# Patient Record
Sex: Female | Born: 1990 | Race: Black or African American | Hispanic: No | Marital: Single | Smoking: Former smoker
Health system: Southern US, Community
[De-identification: ages and names within clinical notes are randomized; demographics above are authoritative.]

## PROBLEM LIST (undated history)

## (undated) ENCOUNTER — Inpatient Hospital Stay (HOSPITAL_COMMUNITY): Payer: Self-pay

## (undated) DIAGNOSIS — Z789 Other specified health status: Secondary | ICD-10-CM

## (undated) HISTORY — PX: NO PAST SURGERIES: SHX2092

---

## 2013-08-16 ENCOUNTER — Encounter (HOSPITAL_COMMUNITY): Payer: Self-pay | Admitting: *Deleted

## 2013-08-16 ENCOUNTER — Inpatient Hospital Stay (HOSPITAL_COMMUNITY)
Admission: AD | Admit: 2013-08-16 | Discharge: 2013-08-16 | Disposition: A | Discharge status: Home / Self Care | Payer: Medicaid - Out of State | Source: Ambulatory Visit | Attending: Obstetrics & Gynecology | Admitting: Obstetrics & Gynecology

## 2013-08-16 ENCOUNTER — Inpatient Hospital Stay (HOSPITAL_COMMUNITY): Payer: Medicaid - Out of State

## 2013-08-16 DIAGNOSIS — O2390 Unspecified genitourinary tract infection in pregnancy, unspecified trimester: Secondary | ICD-10-CM | POA: Diagnosis not present

## 2013-08-16 DIAGNOSIS — O9989 Other specified diseases and conditions complicating pregnancy, childbirth and the puerperium: Secondary | ICD-10-CM | POA: Diagnosis not present

## 2013-08-16 DIAGNOSIS — O26899 Other specified pregnancy related conditions, unspecified trimester: Secondary | ICD-10-CM

## 2013-08-16 DIAGNOSIS — O99891 Other specified diseases and conditions complicating pregnancy: Secondary | ICD-10-CM | POA: Insufficient documentation

## 2013-08-16 DIAGNOSIS — N83209 Unspecified ovarian cyst, unspecified side: Secondary | ICD-10-CM | POA: Insufficient documentation

## 2013-08-16 DIAGNOSIS — K5900 Constipation, unspecified: Secondary | ICD-10-CM | POA: Insufficient documentation

## 2013-08-16 DIAGNOSIS — Z87891 Personal history of nicotine dependence: Secondary | ICD-10-CM | POA: Insufficient documentation

## 2013-08-16 DIAGNOSIS — B373 Candidiasis of vulva and vagina: Secondary | ICD-10-CM | POA: Diagnosis not present

## 2013-08-16 DIAGNOSIS — R109 Unspecified abdominal pain: Secondary | ICD-10-CM

## 2013-08-16 DIAGNOSIS — B3731 Acute candidiasis of vulva and vagina: Secondary | ICD-10-CM | POA: Diagnosis not present

## 2013-08-16 DIAGNOSIS — R1032 Left lower quadrant pain: Secondary | ICD-10-CM | POA: Diagnosis present

## 2013-08-16 HISTORY — DX: Other specified health status: Z78.9

## 2013-08-16 LAB — WET PREP, GENITAL
Clue Cells Wet Prep HPF POC: NONE SEEN
Trich, Wet Prep: NONE SEEN
WBC, Wet Prep HPF POC: NONE SEEN

## 2013-08-16 LAB — URINALYSIS, ROUTINE W REFLEX MICROSCOPIC
Bilirubin Urine: NEGATIVE
GLUCOSE, UA: NEGATIVE mg/dL
HGB URINE DIPSTICK: NEGATIVE
KETONES UR: NEGATIVE mg/dL
Leukocytes, UA: NEGATIVE
Nitrite: NEGATIVE
Protein, ur: NEGATIVE mg/dL
Specific Gravity, Urine: 1.015 (ref 1.005–1.030)
UROBILINOGEN UA: 1 mg/dL (ref 0.0–1.0)
pH: 6.5 (ref 5.0–8.0)

## 2013-08-16 LAB — CBC WITH DIFFERENTIAL/PLATELET
BASOS ABS: 0 10*3/uL (ref 0.0–0.1)
BASOS PCT: 0 % (ref 0–1)
EOS PCT: 0 % (ref 0–5)
Eosinophils Absolute: 0 10*3/uL (ref 0.0–0.7)
HEMATOCRIT: 31.9 % — AB (ref 36.0–46.0)
HEMOGLOBIN: 10.4 g/dL — AB (ref 12.0–15.0)
Lymphocytes Relative: 40 % (ref 12–46)
Lymphs Abs: 1.9 10*3/uL (ref 0.7–4.0)
MCH: 27.4 pg (ref 26.0–34.0)
MCHC: 32.6 g/dL (ref 30.0–36.0)
MCV: 83.9 fL (ref 78.0–100.0)
MONO ABS: 0.7 10*3/uL (ref 0.1–1.0)
MONOS PCT: 15 % — AB (ref 3–12)
Neutro Abs: 2.1 10*3/uL (ref 1.7–7.7)
Neutrophils Relative %: 45 % (ref 43–77)
Platelets: 235 10*3/uL (ref 150–400)
RBC: 3.8 MIL/uL — ABNORMAL LOW (ref 3.87–5.11)
RDW: 14.4 % (ref 11.5–15.5)
WBC: 4.7 10*3/uL (ref 4.0–10.5)

## 2013-08-16 LAB — HCG, QUANTITATIVE, PREGNANCY: hCG, Beta Chain, Quant, S: 129 m[IU]/mL — ABNORMAL HIGH (ref <5)

## 2013-08-16 LAB — ABO/RH: ABO/RH(D): O POS

## 2013-08-16 LAB — POCT PREGNANCY, URINE: Preg Test, Ur: POSITIVE — AB

## 2013-08-16 LAB — HIV ANTIBODY (ROUTINE TESTING W REFLEX): HIV 1&2 Ab, 4th Generation: NONREACTIVE

## 2013-08-16 MED ORDER — FLUCONAZOLE 150 MG PO TABS
150.0000 mg | ORAL_TABLET | Freq: Once | ORAL | Status: AC
  Administered 2013-08-16: 150 mg via ORAL
  Filled 2013-08-16: qty 1

## 2013-08-16 NOTE — Discharge Instructions (Signed)
Pregnancy, First Trimester °The first trimester is the first 3 months your baby is growing inside you. It is important to follow your doctor's instructions. °HOME CARE  °· Do not smoke. °· Do not drink alcohol. °· Only take medicine as told by your doctor. °· Exercise. °· Eat healthy foods. Eat regular, well-balanced meals. °· You can have sex (intercourse) if there are no other problems with the pregnancy. °· Things that help with morning sickness: °¨ Eat soda crackers before getting up in the morning. °¨ Eat 4 to 5 small meals rather than 3 large meals. °¨ Drink liquids between meals, not during meals. °· Go to all appointments as told. °· Take all vitamins or supplements as told by your doctor. °GET HELP RIGHT AWAY IF:  °· You develop a fever. °· You have a bad smelling fluid that is leaking from your vagina. °· There is bleeding from the vagina. °· You develop severe belly (abdominal) or back pain. °· You throw up (vomit) blood. It may look like coffee grounds. °· You lose more than 2 pounds in a week. °· You gain 5 pounds or more in a week. °· You gain more than 2 pounds in a week and you see puffiness (swelling) in your feet, ankles, or legs. °· You have severe dizziness or pass out (faint). °· You are around people who have German measles, chickenpox, or fifth disease. °· You have a headache, watery poop (diarrhea), pain with peeing (urinating), or cannot breath right. °Document Released: 07/13/2007 Document Revised: 04/18/2011 Document Reviewed: 12/04/2012 °ExitCare® Patient Information ©2015 [employer]. This information is not intended to replace advice given to you by your health care provider. Make sure you discuss any questions you have with your health care provider. ° °

## 2013-08-16 NOTE — MAU Provider Note (Signed)
History     CSN: [id number]  Arrival date and time: 08/16/13 1123   First Provider Initiated Contact with Patient 08/16/13 1153      Chief Complaint  Patient presents with  . Abdominal Pain  . Possible Pregnancy   HPI  [name] is a 23 y/o G2P1001 at 8152w1d who presents with 1 week duration LLQ abdominal discomfort. Pain is rated as moderate to severe achy soreness that is intermittent and comes on suddenly. Patient has a past Hx of similar discomfort that she associates with long standing constipation and incidentally identified ovarian cysts. She notes associated nausea x 6 days, urinary frequency, constipation, HA and decreased appetite. Patient is sexually active and was  previously using NuvaRing but decided not to replace the contraception device after she noticed a cessation of her chronic constipation with its removal approximately 5 weeks prior to today's visit.   Patient states that [facility] the previous night and was told that her beta HCG came back "in he 100's" but she was unable to wait around for follow up ultrasound and repeat b-HCG.   Patient denies fever, chills, recent sick contacts, weakness, diziness, vomiting, bleeding, painful urination, vaginal discharge.   OB History   Grav Para Term Preterm Abortions TAB SAB Ect Mult Living   2 1 1       1       Past Medical History  Diagnosis Date  . Medical history non-contributory     Past Surgical History  Procedure Laterality Date  . No past surgeries      History reviewed. No pertinent family history.  History  Substance Use Topics  . Smoking status: Former Games developermoker  . Smokeless tobacco: Never Used  . Alcohol Use: No    Allergies: No Known Allergies  Prescriptions prior to admission  Medication Sig Dispense Refill  . docusate sodium (COLACE) 100 MG capsule Take 100 mg by mouth daily.        Review of Systems  Constitutional: Negative for fever and  malaise/fatigue.  Gastrointestinal: Positive for nausea, abdominal pain and constipation. Negative for vomiting and diarrhea.  Genitourinary: Positive for frequency. Negative for dysuria and urgency.       Neg -vaginal bleeding, discharge   Physical Exam   Blood pressure 130/67, pulse 74, temperature 98.7 F (37.1 C), temperature source Oral, resp. rate 18, height 5' 3.25" (1.607 m), weight 176 lb (79.833 kg), last menstrual period 07/18/2013.  Physical Exam  Constitutional: She is oriented to person, place, and time. She appears well-developed and well-nourished. No distress.  HENT:  Head: Normocephalic and atraumatic.  Eyes: Right eye exhibits no discharge. Left eye exhibits no discharge. No scleral icterus.  Neck: Normal range of motion. Neck supple.  Cardiovascular: Normal rate, regular rhythm, normal heart sounds and intact distal pulses.   Respiratory: Effort normal and breath sounds normal. No respiratory distress.  GI: Soft. Bowel sounds are normal. She exhibits no distension and no mass. There is tenderness. There is no rebound and no guarding.   Mild tenderness to light palpation in the LLQ, no tenderness to percussion.   Genitourinary: Vagina normal and uterus normal. Uterus is not tender. Right adnexum displays no mass and no tenderness. Left adnexum displays no mass and no tenderness. No bleeding around the vagina. No vaginal discharge found.  Neurological: She is alert and oriented to person, place, and time. Coordination normal.  Skin: Skin is warm and dry. No erythema.  Psychiatric: She has a  normal mood and affect. Her behavior is normal.   Results for orders placed during the hospital encounter of 08/16/13 (from the past 24 hour(s))  URINALYSIS, ROUTINE W REFLEX MICROSCOPIC     Status: None   Collection Time    08/16/13 11:40 AM      Result Value Ref Range   Color, Urine YELLOW  YELLOW   APPearance CLEAR  CLEAR   Specific Gravity, Urine 1.015  1.005 - 1.030   pH  6.5  5.0 - 8.0   Glucose, UA NEGATIVE  NEGATIVE mg/dL   Hgb urine dipstick NEGATIVE  NEGATIVE   Bilirubin Urine NEGATIVE  NEGATIVE   Ketones, ur NEGATIVE  NEGATIVE mg/dL   Protein, ur NEGATIVE  NEGATIVE mg/dL   Urobilinogen, UA 1.0  0.0 - 1.0 mg/dL   Nitrite NEGATIVE  NEGATIVE   Leukocytes, UA NEGATIVE  NEGATIVE  POCT PREGNANCY, URINE     Status: Abnormal   Collection Time    08/16/13 11:44 AM      Result Value Ref Range   Preg Test, Ur POSITIVE (*) NEGATIVE  CBC WITH DIFFERENTIAL     Status: Abnormal   Collection Time    08/16/13 12:00 PM      Result Value Ref Range   WBC 4.7  4.0 - 10.5 K/uL   RBC 3.80 (*) 3.87 - 5.11 MIL/uL   Hemoglobin 10.4 (*) 12.0 - 15.0 g/dL   HCT 16.1 (*) 09.6 - 04.5 %   MCV 83.9  78.0 - 100.0 fL   MCH 27.4  26.0 - 34.0 pg   MCHC 32.6  30.0 - 36.0 g/dL   RDW 40.9  81.1 - 91.4 %   Platelets 235  150 - 400 K/uL   Neutrophils Relative % 45  43 - 77 %   Neutro Abs 2.1  1.7 - 7.7 K/uL   Lymphocytes Relative 40  12 - 46 %   Lymphs Abs 1.9  0.7 - 4.0 K/uL   Monocytes Relative 15 (*) 3 - 12 %   Monocytes Absolute 0.7  0.1 - 1.0 K/uL   Eosinophils Relative 0  0 - 5 %   Eosinophils Absolute 0.0  0.0 - 0.7 K/uL   Basophils Relative 0  0 - 1 %   Basophils Absolute 0.0  0.0 - 0.1 K/uL  ABO/RH     Status: None   Collection Time    08/16/13 12:00 PM      Result Value Ref Range   ABO/RH(D) O POS    HCG, QUANTITATIVE, PREGNANCY     Status: Abnormal   Collection Time    08/16/13 12:00 PM      Result Value Ref Range   hCG, Beta Chain, Quant, S 129 (*) <5 mIU/mL  WET PREP, GENITAL     Status: Abnormal   Collection Time    08/16/13 12:23 PM      Result Value Ref Range   Yeast Wet Prep HPF POC FEW (*) NONE SEEN   Trich, Wet Prep NONE SEEN  NONE SEEN   Clue Cells Wet Prep HPF POC NONE SEEN  NONE SEEN   WBC, Wet Prep HPF POC NONE SEEN  NONE SEEN    US Ob Comp Less 14 Wks  08/16/2013   CLINICAL DATA:  Lower abdominal pelvic pain, quantitative beta HCG 129   EXAM: OBSTETRIC <14 WK [place] AND TRANSVAGINAL OB US  TECHNIQUE: Both transabdominal and transvaginal ultrasound examinations were performed for complete evaluation of the gestation as well  as the maternal uterus, adnexal regions, and pelvic cul-de-sac. Transvaginal technique was performed to assess early pregnancy.  COMPARISON:  None.  FINDINGS: Intrauterine gestational sac: Not visualized  Yolk sac:  Not visualized  Embryo:  Not visualized  Cardiac Activity: Not visualized  Heart Rate:  Not detected  Maternal uterus/adnexae: Uterus and endometrium appear normal for age and unremarkable. No visualized IUP or adnexal abnormality. Ovaries are unremarkable. No significant free fluid. Trace physiologic fluid noted.  IMPRESSION: No visualized intrauterine pregnancy by ultrasound. No acute finding evident.   Electronically Signed   By: Ruel Favors M.D.   On: 08/16/2013 13:17   US Ob Transvaginal  08/16/2013   CLINICAL DATA:  Lower abdominal pelvic pain, quantitative beta HCG 129  EXAM: OBSTETRIC <14 WK [place] AND TRANSVAGINAL OB US  TECHNIQUE: Both transabdominal and transvaginal ultrasound examinations were performed for complete evaluation of the gestation as well as the maternal uterus, adnexal regions, and pelvic cul-de-sac. Transvaginal technique was performed to assess early pregnancy.  COMPARISON:  None.  FINDINGS: Intrauterine gestational sac: Not visualized  Yolk sac:  Not visualized  Embryo:  Not visualized  Cardiac Activity: Not visualized  Heart Rate:  Not detected  Maternal uterus/adnexae: Uterus and endometrium appear normal for age and unremarkable. No visualized IUP or adnexal abnormality. Ovaries are unremarkable. No significant free fluid. Trace physiologic fluid noted.  IMPRESSION: No visualized intrauterine pregnancy by ultrasound. No acute finding evident.   Electronically Signed   By: Ruel Favors M.D.   On: 08/16/2013 13:17    MAU Course  Procedures None  MDM +UPT  Assessment and Plan    Ectopic vs intrauterine pregnancy. R/O ectopy. CBC, UA, GC, wet prep, transvaginal ultrasound,  blood type and screen, b-HCG and F/U b-HCG in 48 hrs.   Vara Guardian PA-S   A: Abdominal pain in pregnancy, antepartum Candidal vulvovaginitis  P: Discharge home Ectopic precautions discussed Patient to return to MAU in 48 hours for follow-up quant hCG or sooner if symptoms worsen  I have seen and evaluated the patient with the NP/PA/Med student. I agree with the assessment and plan as written above.   Freddi Starr, PA-C  08/16/2013 3:14 PM

## 2013-08-16 NOTE — MAU Provider Note (Signed)

## 2013-08-16 NOTE — MAU Note (Addendum)
Wasn't feeling right, had 4 +HPT prior to going to hospital. [facility].  hcg level was low, was unable to stay for US as she had to go to work. Having pain on left side- was told needed to get checked further

## 2013-08-18 ENCOUNTER — Inpatient Hospital Stay (HOSPITAL_COMMUNITY)
Admission: AD | Admit: 2013-08-18 | Discharge: 2013-08-18 | Disposition: A | Discharge status: Home / Self Care | Payer: Medicaid - Out of State | Source: Ambulatory Visit | Attending: Obstetrics & Gynecology | Admitting: Obstetrics & Gynecology

## 2013-08-18 DIAGNOSIS — O99891 Other specified diseases and conditions complicating pregnancy: Secondary | ICD-10-CM

## 2013-08-18 DIAGNOSIS — K5900 Constipation, unspecified: Secondary | ICD-10-CM

## 2013-08-18 DIAGNOSIS — R1032 Left lower quadrant pain: Secondary | ICD-10-CM | POA: Diagnosis present

## 2013-08-18 DIAGNOSIS — O3680X0 Pregnancy with inconclusive fetal viability, not applicable or unspecified: Secondary | ICD-10-CM

## 2013-08-18 DIAGNOSIS — Z87891 Personal history of nicotine dependence: Secondary | ICD-10-CM | POA: Diagnosis not present

## 2013-08-18 DIAGNOSIS — O9989 Other specified diseases and conditions complicating pregnancy, childbirth and the puerperium: Principal | ICD-10-CM

## 2013-08-18 LAB — HCG, QUANTITATIVE, PREGNANCY: HCG, BETA CHAIN, QUANT, S: 110 m[IU]/mL — AB (ref <5)

## 2013-08-18 NOTE — Discharge Instructions (Signed)

## 2013-08-18 NOTE — MAU Provider Note (Signed)
  History     CSN: [id number]  Arrival date and time: 08/18/13 1428   First Provider Initiated Contact with Patient 08/18/13 1657      Chief Complaint  Patient presents with  . Follow-up   HPI Comments: Grant RutsShaqkia O Chillemi 23 y.o. G2P1001 4260w3d comes to MAU to follow up with pregnancy of unknown location. She denies any pain or bleeding. [place] normally seen in [place] but plans pregnancy care in [place]. She feels her LLQ pain was from her chronic constipation. This is a desired pregnancy     Past Medical History  Diagnosis Date  . Medical history non-contributory     Past Surgical History  Procedure Laterality Date  . No past surgeries      No family history on file.  History  Substance Use Topics  . Smoking status: Former Games developermoker  . Smokeless tobacco: Never Used  . Alcohol Use: No    Allergies: No Known Allergies  Prescriptions prior to admission  Medication Sig Dispense Refill  . docusate sodium (COLACE) 100 MG capsule Take 100 mg by mouth daily.        Review of Systems  Constitutional: Negative.   HENT: Negative.   Cardiovascular: Negative.   Gastrointestinal: Negative.   Genitourinary: Negative.   Musculoskeletal: Negative.   Skin: Negative.   Neurological: Negative.   Psychiatric/Behavioral: Negative.    Physical Exam   Blood pressure 119/75, pulse 71, temperature 98.2 F (36.8 C), temperature source Oral, resp. rate 16, last menstrual period 07/18/2013.  Physical Exam  Constitutional: She is oriented to person, place, and time. She appears well-developed and well-nourished. No distress.  HENT:  Head: Normocephalic and atraumatic.  Eyes: Conjunctivae are normal. Pupils are equal, round, and reactive to light.  Musculoskeletal: Normal range of motion.  Neurological: She is alert and oriented to person, place, and time.  Skin: Skin is warm and dry.  Psychiatric: She has a normal mood and affect. Her behavior is normal. Thought content normal.    Lab Results  Component Value Date   HCGBETAQNT 110* 08/18/2013   HCGBETAQNT 129* 08/16/2013      MAU Course  Procedures  MDM   Assessment and Plan  A: Pregnancy unknown Location  P: Repeat Quant in 48 hours Advised any pain/ bleeding return to MAU Ectopic precautions  Carolynn ServeBarefoot, Berle Fitz Miller 08/18/2013, 5:13 PM

## 2013-08-18 NOTE — MAU Note (Signed)
Here for rpt blood work. No c/o, feeling good.  Denies pain or bleeding.

## 2013-08-19 ENCOUNTER — Encounter (HOSPITAL_COMMUNITY): Payer: Self-pay | Admitting: *Deleted

## 2013-08-19 ENCOUNTER — Inpatient Hospital Stay (HOSPITAL_COMMUNITY)
Admission: AD | Admit: 2013-08-19 | Discharge: 2013-08-19 | Disposition: A | Discharge status: Home / Self Care | Payer: Medicaid - Out of State | Source: Ambulatory Visit | Attending: Obstetrics & Gynecology | Admitting: Obstetrics & Gynecology

## 2013-08-19 DIAGNOSIS — O209 Hemorrhage in early pregnancy, unspecified: Secondary | ICD-10-CM | POA: Diagnosis present

## 2013-08-19 DIAGNOSIS — O039 Complete or unspecified spontaneous abortion without complication: Secondary | ICD-10-CM | POA: Diagnosis not present

## 2013-08-19 DIAGNOSIS — Z87891 Personal history of nicotine dependence: Secondary | ICD-10-CM | POA: Diagnosis not present

## 2013-08-19 LAB — CBC
HCT: 33.5 % — ABNORMAL LOW (ref 36.0–46.0)
Hemoglobin: 10.8 g/dL — ABNORMAL LOW (ref 12.0–15.0)
MCH: 27.2 pg (ref 26.0–34.0)
MCHC: 32.2 g/dL (ref 30.0–36.0)
MCV: 84.4 fL (ref 78.0–100.0)
PLATELETS: 228 10*3/uL (ref 150–400)
RBC: 3.97 MIL/uL (ref 3.87–5.11)
RDW: 14.4 % (ref 11.5–15.5)
WBC: 4.4 10*3/uL (ref 4.0–10.5)

## 2013-08-19 LAB — HCG, QUANTITATIVE, PREGNANCY: hCG, Beta Chain, Quant, S: 97 m[IU]/mL — ABNORMAL HIGH (ref <5)

## 2013-08-19 LAB — GC/CHLAMYDIA PROBE AMP
CT PROBE, AMP APTIMA: NEGATIVE
GC PROBE AMP APTIMA: NEGATIVE

## 2013-08-19 NOTE — Discharge Instructions (Signed)
Miscarriage A miscarriage is the sudden loss of an unborn baby (fetus) before the 20th week of pregnancy. Most miscarriages happen in the first 3 months of pregnancy. Sometimes, it happens before a woman even knows she is pregnant. A miscarriage is also called a "spontaneous miscarriage" or "early pregnancy loss." Having a miscarriage can be an emotional experience. Talk with your caregiver about any questions you may have about miscarrying, the grieving process, and your future pregnancy plans. CAUSES   Problems with the fetal chromosomes that make it impossible for the baby to develop normally. Problems with the baby's genes or chromosomes are most often the result of errors that occur, by chance, as the embryo divides and grows. The problems are not inherited from the parents.  Infection of the cervix or uterus.   Hormone problems.   Problems with the cervix, such as having an incompetent cervix. This is when the tissue in the cervix is not strong enough to hold the pregnancy.   Problems with the uterus, such as an abnormally shaped uterus, uterine fibroids, or congenital abnormalities.   Certain medical conditions.   Smoking, drinking alcohol, or taking illegal drugs.   Trauma.  Often, the cause of a miscarriage is unknown.  SYMPTOMS   Vaginal bleeding or spotting, with or without cramps or pain.  Pain or cramping in the abdomen or lower back.  Passing fluid, tissue, or blood clots from the vagina. DIAGNOSIS  Your caregiver will perform a physical exam. You may also have an ultrasound to confirm the miscarriage. Blood or urine tests may also be ordered. TREATMENT   Sometimes, treatment is not necessary if you naturally pass all the fetal tissue that was in the uterus. If some of the fetus or placenta remains in the body (incomplete miscarriage), tissue left behind may become infected and must be removed. Usually, a dilation and curettage (D and C) procedure is performed.  During a D and C procedure, the cervix is widened (dilated) and any remaining fetal or placental tissue is gently removed from the uterus.  Antibiotic medicines are prescribed if there is an infection. Other medicines may be given to reduce the size of the uterus (contract) if there is a lot of bleeding.  If you have Rh negative blood and your baby was Rh positive, you will need a Rh immunoglobulin shot. This shot will protect any future baby from having Rh blood problems in future pregnancies. HOME CARE INSTRUCTIONS   Your caregiver may order bed rest or may allow you to continue light activity. Resume activity as directed by your caregiver.  Have someone help with home and family responsibilities during this time.   Keep track of the number of sanitary pads you use each day and how soaked (saturated) they are. Write down this information.   Do not use tampons. Do not douche or have sexual intercourse until approved by your caregiver.   Only take over-the-counter or prescription medicines for pain or discomfort as directed by your caregiver.   Do not take aspirin. Aspirin can cause bleeding.   Keep all follow-up appointments with your caregiver.   If you or your partner have problems with grieving, talk to your caregiver or seek counseling to help cope with the pregnancy loss. Allow enough time to grieve before trying to get pregnant again.  SEEK IMMEDIATE MEDICAL CARE IF:   You have severe cramps or pain in your back or abdomen.  You have a fever.  You pass large blood clots (walnut-sized   or larger) ortissue from your vagina. Save any tissue for your caregiver to inspect.   Your bleeding increases.   You have a thick, bad-smelling vaginal discharge.  You become lightheaded, weak, or you faint.   You have chills.  MAKE SURE YOU:  Understand these instructions.  Will watch your condition.  Will get help right away if you are not doing well or get  worse. Document Released: 07/20/2000 Document Revised: 05/21/2012 Document Reviewed: 03/15/2011 ExitCare Patient Information 2015 [employer]. This information is not intended to replace advice given to you by your health care provider. Make sure you discuss any questions you have with your health care provider.  

## 2013-08-19 NOTE — MAU Note (Signed)
Pt returns with increased vaginal bleeding. Seen yesterday for same.

## 2013-08-19 NOTE — MAU Provider Note (Signed)
  History     CSN: [id number]  Arrival date and time: 08/19/13 0446   First Provider Initiated Contact with Patient 08/19/13 [phone]      Chief Complaint  Patient presents with  . Vaginal Bleeding   Vaginal Bleeding    Brianna FredricksonShaqkia O Elliott is a 23 y.o. G2P1001 at 8284w4d who presents today with vaginal bleeding. She states that she started bleeding around 2100 last night. She denies any pain today. She has been followed with serial HCGs and they have been decreasing at this time, although slowly.   Past Medical History  Diagnosis Date  . Medical history non-contributory     Past Surgical History  Procedure Laterality Date  . No past surgeries      History reviewed. No pertinent family history.  History  Substance Use Topics  . Smoking status: Former Games developermoker  . Smokeless tobacco: Never Used  . Alcohol Use: No    Allergies: No Known Allergies  Prescriptions prior to admission  Medication Sig Dispense Refill  . docusate sodium (COLACE) 100 MG capsule Take 100 mg by mouth daily.        Review of Systems  Genitourinary: Positive for vaginal bleeding.   Physical Exam   Blood pressure 112/67, pulse 67, temperature 98.4 F (36.9 C), temperature source Oral, resp. rate 18, height 5\' 3"  (1.6 m), weight 79.833 kg (176 lb), last menstrual period 07/18/2013.  Physical Exam  Nursing note and vitals reviewed. Constitutional: She is oriented to person, place, and time. She appears well-developed and well-nourished. No distress.  Cardiovascular: Normal rate.   Respiratory: Effort normal.  GI: Soft. There is no tenderness. There is no rebound.  Neurological: She is alert and oriented to person, place, and time.  Skin: Skin is warm and dry.  Psychiatric: She has a normal mood and affect.    MAU Course  Procedures   Results for Brianna FredricksonWIMBUSH, Nea O (MRN [id number]) as of 08/19/2013 05:32  Ref. Range 08/16/2013 12:00 08/16/2013 12:23 08/16/2013 13:08 08/18/2013 15:17 08/19/2013 05:00   hCG, Beta Chain, Quant, S Latest Range: <5 mIU/mL 129 (H)   110 (H) 97 (H)   Assessment and Plan   1. SAB (spontaneous abortion)    Bleeding precautions Return to MAU as needed  Follow-up Information   [facility] (Monday July 20th between 8am-11am)    Specialty:  Obstetrics and Gynecology   Contact information:   [address] [place] [address] [phone]       Tawnya CrookHogan, Heather Donovan 08/19/2013, 5:09 AM

## 2013-08-21 ENCOUNTER — Encounter (HOSPITAL_COMMUNITY): Payer: Self-pay | Admitting: *Deleted

## 2013-08-21 ENCOUNTER — Inpatient Hospital Stay (HOSPITAL_COMMUNITY)
Admission: AD | Admit: 2013-08-21 | Discharge: 2013-08-22 | Disposition: A | Discharge status: Home / Self Care | Payer: Medicaid - Out of State | Source: Ambulatory Visit | Attending: Obstetrics & Gynecology | Admitting: Obstetrics & Gynecology

## 2013-08-21 DIAGNOSIS — O039 Complete or unspecified spontaneous abortion without complication: Secondary | ICD-10-CM | POA: Insufficient documentation

## 2013-08-21 DIAGNOSIS — Z87891 Personal history of nicotine dependence: Secondary | ICD-10-CM | POA: Insufficient documentation

## 2013-08-21 MED ORDER — OXYCODONE-ACETAMINOPHEN 5-325 MG PO TABS
1.0000 | ORAL_TABLET | Freq: Once | ORAL | Status: AC
  Administered 2013-08-22: 1 via ORAL
  Filled 2013-08-21: qty 1

## 2013-08-21 NOTE — MAU Provider Note (Signed)
History     CSN: [id number]  Arrival date and time: 08/21/13 2232   First Provider Initiated Contact with Patient 08/21/13 2340      Chief Complaint  Patient presents with  . Vaginal Bleeding  . Abdominal Pain   HPI Ms. [name] is a 23 y.o. G2P1001 at 6117w0d who was diagnosed with SAB with decreasing quant hCGs on Monday. She states that since then she has had increased vaginal bleeding and lower abdominal and low back pain. She states she has used 4 pads today. She is endorsing fatigue, but denies dizziness or weakness or fever.   OB History   Grav Para Term Preterm Abortions TAB SAB Ect Mult Living   2 1 1       1       Past Medical History  Diagnosis Date  . Medical history non-contributory     Past Surgical History  Procedure Laterality Date  . No past surgeries      History reviewed. No pertinent family history.  History  Substance Use Topics  . Smoking status: Former Games developermoker  . Smokeless tobacco: Never Used  . Alcohol Use: No    Allergies: No Known Allergies  Prescriptions prior to admission  Medication Sig Dispense Refill  . naproxen sodium (ANAPROX) 220 MG tablet Take 220 mg by mouth 2 (two) times daily with a meal.      . docusate sodium (COLACE) 100 MG capsule Take 100 mg by mouth daily.        Review of Systems  Constitutional: Positive for chills and malaise/fatigue. Negative for fever.  Gastrointestinal: Positive for nausea and abdominal pain. Negative for vomiting, diarrhea and constipation.  Genitourinary:       + vaginal bleeding  Neurological: Negative for dizziness, loss of consciousness and weakness.   Physical Exam   Blood pressure 117/62, pulse 65, temperature 97.9 F (36.6 C), temperature source Oral, resp. rate 18, height 5\' 3"  (1.6 m), weight 176 lb (79.833 kg), last menstrual period 07/18/2013, SpO2 100.00%.  Physical Exam  Constitutional: She is oriented to person, place, and time. She appears well-developed and  well-nourished. No distress.  HENT:  Head: Normocephalic and atraumatic.  Cardiovascular: Normal rate.   Respiratory: Effort normal.  GI: Soft. She exhibits no distension and no mass. There is no tenderness. There is no rebound and no guarding.  Genitourinary: Uterus is tender (mild). Uterus is not enlarged. Cervix exhibits no motion tenderness, no discharge and no friability. Right adnexum displays no mass and no tenderness. Left adnexum displays tenderness (mild). Left adnexum displays no mass. There is bleeding (small amount of blood in the vaginal vault) around the vagina. No vaginal discharge found.  Neurological: She is alert and oriented to person, place, and time.  Skin: Skin is warm and dry. No erythema.  Psychiatric: She has a normal mood and affect.   Results for orders placed during the hospital encounter of 08/21/13 (from the past 24 hour(s))  CBC WITH DIFFERENTIAL     Status: Abnormal   Collection Time    08/22/13 12:25 AM      Result Value Ref Range   WBC 4.9  4.0 - 10.5 K/uL   RBC 3.86 (*) 3.87 - 5.11 MIL/uL   Hemoglobin 10.4 (*) 12.0 - 15.0 g/dL   HCT 78.233.0 (*) 95.636.0 - 21.346.0 %   MCV 85.5  78.0 - 100.0 fL   MCH 26.9  26.0 - 34.0 pg   MCHC 31.5  30.0 - 36.0 g/dL  RDW 14.5  11.5 - 15.5 %   Platelets 229  150 - 400 K/uL   Neutrophils Relative % 43  43 - 77 %   Neutro Abs 2.1  1.7 - 7.7 K/uL   Lymphocytes Relative 49 (*) 12 - 46 %   Lymphs Abs 2.4  0.7 - 4.0 K/uL   Monocytes Relative 7  3 - 12 %   Monocytes Absolute 0.4  0.1 - 1.0 K/uL   Eosinophils Relative 1  0 - 5 %   Eosinophils Absolute 0.0  0.0 - 0.7 K/uL   Basophils Relative 0  0 - 1 %   Basophils Absolute 0.0  0.0 - 0.1 K/uL  HCG, QUANTITATIVE, PREGNANCY     Status: Abnormal   Collection Time    08/22/13 12:25 AM      Result Value Ref Range   hCG, Beta Chain, Quant, S 67 (*) <5 mIU/mL    MAU Course  Procedures None  MDM CBC, quant hCG today 1 Percocet given in MAU - patient reports improvement in  pain Patient is hemodynamically stable  Assessment and Plan  A: SAB  P: Discharge home Rx for Percocet given to patient Bleeding precautions discussed Patient to follow-up with [facility] in 1 week for follow-up labs Patient may return to MAU as needed or if her condition were to change or worsen  Freddi Starr, PA-C  08/22/2013, 1:38 AM

## 2013-08-21 NOTE — MAU Note (Signed)
Pt reports she has been coming in for repeat BHCG's  and she is having heavy bleeding and pain.

## 2013-08-22 DIAGNOSIS — O039 Complete or unspecified spontaneous abortion without complication: Secondary | ICD-10-CM

## 2013-08-22 DIAGNOSIS — Z87891 Personal history of nicotine dependence: Secondary | ICD-10-CM | POA: Diagnosis not present

## 2013-08-22 LAB — HCG, QUANTITATIVE, PREGNANCY: HCG, BETA CHAIN, QUANT, S: 67 m[IU]/mL — AB (ref <5)

## 2013-08-22 LAB — CBC WITH DIFFERENTIAL/PLATELET
BASOS ABS: 0 10*3/uL (ref 0.0–0.1)
Basophils Relative: 0 % (ref 0–1)
EOS ABS: 0 10*3/uL (ref 0.0–0.7)
EOS PCT: 1 % (ref 0–5)
HCT: 33 % — ABNORMAL LOW (ref 36.0–46.0)
Hemoglobin: 10.4 g/dL — ABNORMAL LOW (ref 12.0–15.0)
LYMPHS PCT: 49 % — AB (ref 12–46)
Lymphs Abs: 2.4 10*3/uL (ref 0.7–4.0)
MCH: 26.9 pg (ref 26.0–34.0)
MCHC: 31.5 g/dL (ref 30.0–36.0)
MCV: 85.5 fL (ref 78.0–100.0)
Monocytes Absolute: 0.4 10*3/uL (ref 0.1–1.0)
Monocytes Relative: 7 % (ref 3–12)
Neutro Abs: 2.1 10*3/uL (ref 1.7–7.7)
Neutrophils Relative %: 43 % (ref 43–77)
PLATELETS: 229 10*3/uL (ref 150–400)
RBC: 3.86 MIL/uL — ABNORMAL LOW (ref 3.87–5.11)
RDW: 14.5 % (ref 11.5–15.5)
WBC: 4.9 10*3/uL (ref 4.0–10.5)

## 2013-08-22 MED ORDER — OXYCODONE-ACETAMINOPHEN 5-325 MG PO TABS: 1.0000 | ORAL_TABLET | Freq: Four times a day (QID) | ORAL | Status: DC | PRN

## 2013-08-22 NOTE — Discharge Instructions (Signed)
Incomplete Miscarriage °A miscarriage is the sudden loss of an unborn baby (fetus) before the 20th week of pregnancy. In an incomplete miscarriage, parts of the fetus or placenta (afterbirth) remain in the body.  °Having a miscarriage can be an emotional experience. Talk with your health care provider about any questions you may have about miscarrying, the grieving process, and your future pregnancy plans. °CAUSES  °· Problems with the fetal chromosomes that make it impossible for the baby to develop normally. Problems with the baby's genes or chromosomes are most often the result of errors that occur by chance as the embryo divides and grows. The problems are not inherited from the parents. °· Infection of the cervix or uterus. °· Hormone problems. °· Problems with the cervix, such as having an incompetent cervix. This is when the tissue in the cervix is not strong enough to hold the pregnancy. °· Problems with the uterus, such as an abnormally shaped uterus, uterine fibroids, or congenital abnormalities. °· Certain medical conditions. °· Smoking, drinking alcohol, or taking illegal drugs. °· Trauma. °SYMPTOMS  °· Vaginal bleeding or spotting, with or without cramps or pain. °· Pain or cramping in the abdomen or lower back. °· Passing fluid, tissue, or blood clots from the vagina. °DIAGNOSIS  °Your health care provider will perform a physical exam. You may also have an ultrasound to confirm the miscarriage. Blood or urine tests may also be ordered. °TREATMENT  °· Usually, a dilation and curettage (D&C) procedure is performed. During a D&C procedure, the cervix is widened (dilated) and any remaining fetal or placental tissue is gently removed from the uterus. °· Antibiotic medicines are prescribed if there is an infection. Other medicines may be given to reduce the size of the uterus (contract) if there is a lot of bleeding. °· If you have Rh negative blood and your baby was Rh positive, you will need an Rho(D)  immune globulin shot. This shot will protect any future baby from having Rh blood problems in future pregnancies. °· You may be confined to bed rest. This means you should stay in bed and only get up to use the bathroom. °HOME CARE INSTRUCTIONS  °· Rest as directed by your health care provider. °· Restrict activity as directed by your health care provider. You may be allowed to continue light activity if curettage was not done but you require further treatment. °· Keep track of the number of pads you use each day. Keep track of how soaked (saturated) they are. Record this information. °· Do not  use tampons. °· Do not douche or have sexual intercourse until approved by your health care provider. °· Keep all follow-up appointments for re-evaluation and continuing management. °· Only take over-the-counter or prescription medicines for pain, fever, or discomfort as directed by your health care provider. °· Take antibiotic medicine as directed by your health care provider. Make sure you finish it even if you start to feel better. °SEEK IMMEDIATE MEDICAL CARE IF:  °· You experience severe cramps in your stomach, back, or abdomen. °· You have an unexplained temperature (make sure to record these temperatures). °· You pass large clots or tissue (save these for your health care provider to inspect). °· Your bleeding increases. °· You become light-headed, weak, or have fainting episodes. °MAKE SURE YOU:  °· Understand these instructions. °· Will watch your condition. °· Will get help right away if you are not doing well or get worse. °Document Released: 01/24/2005 Document Revised: 11/14/2012 Document Reviewed: 08/23/2012 °  ExitCare® Patient Information ©2015 [employer], LLC. This information is not intended to replace advice given to you by your health care provider. Make sure you discuss any questions you have with your health care provider. ° °

## 2013-08-28 ENCOUNTER — Other Ambulatory Visit: Payer: Medicaid - Out of State

## 2013-08-28 ENCOUNTER — Encounter: Payer: Self-pay | Admitting: General Practice

## 2013-08-28 ENCOUNTER — Telehealth: Payer: Self-pay | Admitting: General Practice

## 2013-08-28 NOTE — Telephone Encounter (Signed)
Patient no showed for bhcg appt. Called patient, no answer- left message stating we are calling in regards to the appt she missed with us this morning and please give our front office staff a call to reschedule this appt as it is very important. Will send letter

## 2013-08-30 ENCOUNTER — Other Ambulatory Visit: Payer: Medicaid - Out of State

## 2013-09-06 ENCOUNTER — Other Ambulatory Visit: Payer: 59

## 2013-09-06 DIAGNOSIS — N939 Abnormal uterine and vaginal bleeding, unspecified: Secondary | ICD-10-CM

## 2013-09-06 LAB — HCG, QUANTITATIVE, PREGNANCY: hCG, Beta Chain, Quant, S: 3.02 m[IU]/mL

## 2013-09-20 ENCOUNTER — Encounter: Payer: Self-pay | Admitting: General Practice

## 2013-12-10 ENCOUNTER — Encounter (HOSPITAL_COMMUNITY): Payer: Self-pay | Admitting: *Deleted

## 2014-01-16 ENCOUNTER — Encounter (HOSPITAL_COMMUNITY): Payer: Self-pay | Admitting: *Deleted

## 2014-01-16 ENCOUNTER — Inpatient Hospital Stay (HOSPITAL_COMMUNITY)
Admission: AD | Admit: 2014-01-16 | Discharge: 2014-01-16 | Disposition: A | Discharge status: Home / Self Care | Payer: Medicaid - Out of State | Source: Ambulatory Visit | Attending: Obstetrics & Gynecology | Admitting: Obstetrics & Gynecology

## 2014-01-16 DIAGNOSIS — O210 Mild hyperemesis gravidarum: Secondary | ICD-10-CM | POA: Diagnosis not present

## 2014-01-16 DIAGNOSIS — Z3A01 Less than 8 weeks gestation of pregnancy: Secondary | ICD-10-CM | POA: Diagnosis not present

## 2014-01-16 DIAGNOSIS — R112 Nausea with vomiting, unspecified: Secondary | ICD-10-CM

## 2014-01-16 DIAGNOSIS — Z87891 Personal history of nicotine dependence: Secondary | ICD-10-CM | POA: Insufficient documentation

## 2014-01-16 DIAGNOSIS — R1032 Left lower quadrant pain: Secondary | ICD-10-CM | POA: Diagnosis not present

## 2014-01-16 DIAGNOSIS — R42 Dizziness and giddiness: Secondary | ICD-10-CM | POA: Diagnosis present

## 2014-01-16 DIAGNOSIS — O26899 Other specified pregnancy related conditions, unspecified trimester: Secondary | ICD-10-CM

## 2014-01-16 LAB — URINALYSIS, ROUTINE W REFLEX MICROSCOPIC
Bilirubin Urine: NEGATIVE
Glucose, UA: NEGATIVE mg/dL
HGB URINE DIPSTICK: NEGATIVE
Ketones, ur: NEGATIVE mg/dL
Leukocytes, UA: NEGATIVE
Nitrite: NEGATIVE
PROTEIN: NEGATIVE mg/dL
Specific Gravity, Urine: >1.03 — ABNORMAL HIGH (ref 1.005–1.030)
UROBILINOGEN UA: 1 mg/dL (ref 0.0–1.0)
pH: 6 (ref 5.0–8.0)

## 2014-01-16 LAB — BASIC METABOLIC PANEL
Anion gap: 12 (ref 5–15)
BUN: 12 mg/dL (ref 6–23)
CO2: 23 mEq/L (ref 19–32)
CREATININE: 0.75 mg/dL (ref 0.50–1.10)
Calcium: 9.2 mg/dL (ref 8.4–10.5)
Chloride: 100 mEq/L (ref 96–112)
Glucose, Bld: 84 mg/dL (ref 70–99)
Potassium: 4.3 mEq/L (ref 3.7–5.3)
Sodium: 135 mEq/L — ABNORMAL LOW (ref 137–147)

## 2014-01-16 LAB — POCT PREGNANCY, URINE: Preg Test, Ur: POSITIVE — AB

## 2014-01-16 MED ORDER — PROMETHAZINE HCL 25 MG PO TABS: 25.0000 mg | ORAL_TABLET | Freq: Four times a day (QID) | ORAL | Status: AC | PRN

## 2014-01-16 MED ORDER — LACTATED RINGERS IV SOLN: INTRAVENOUS | Status: DC

## 2014-01-16 NOTE — MAU Provider Note (Signed)
History     CSN: [id number]  Arrival date and time: 01/16/14 1512   First Provider Initiated Contact with Patient 01/16/14 1608      Chief Complaint  Patient presents with  . Abdominal Pain  . Dizziness   HPI This is a 23 y.o. female at 8167w2d who presents with c/o dizziness since receiving Phenergan last night. Did not have any before the med. Also c/o LLQ pain for about a week. Went to hospital ER in [place] 2 nights ago and states they did an US showing a "normal pregnancy" with FHTs and may have told her something about a cyst on her ovary on the left. Then she went to the ED in [place] last night and was told she had low potassium and was given "two antibiotics" (one sounds like Zithromax, was 4 pills).  States they would not tell her what they were treating. States both hospitals told her her potassium was low but they did not supplement her potassium. States she saw "the same doctor each time, he works at both places".  States was supposed to go to his office but could not afford it.    RN Note:  Expand All Collapse All   Went to ED in [place] last night, for abd pain. Pain continues and is now really dizzy room spins from med. +preg dx in [place] 2 wks ago. Pt states she went to an appointment in [place] and was told that they did not take medcaid and that her appointment today would be $250         Pt states she has been having stomach pain for a week now.Pt says she feels dizzy and she feels like she is going to pass out when she passes out.        OB History    Gravida Para Term Preterm AB TAB SAB Ectopic Multiple Living   3 1 1       1       Past Medical History  Diagnosis Date  . Medical history non-contributory     Past Surgical History  Procedure Laterality Date  . No past surgeries      History reviewed. No pertinent family history.  History  Substance Use Topics  . Smoking status: Former Games developermoker  . Smokeless tobacco: Never Used  . Alcohol Use:  No    Allergies:  Allergies  Allergen Reactions  . Tramadol Other (See Comments)    Causes dizziness.    Prescriptions prior to admission  Medication Sig Dispense Refill Last Dose  . Prenatal Vit-Fe Fumarate-FA (PRENATAL MULTIVITAMIN) TABS tablet Take 1 tablet by mouth daily at 12 noon.   Past Week at Unknown time  . oxyCODONE-acetaminophen (ROXICET) 5-325 MG per tablet Take 1-2 tablets by mouth every 6 (six) hours as needed for severe pain. (Patient not taking: Reported on 01/16/2014) 10 tablet 0 Not Taking at Unknown time    Review of Systems  Constitutional: Negative for fever, chills and malaise/fatigue.  Gastrointestinal: Positive for nausea ("does not feel like eating") and abdominal pain. Negative for vomiting, diarrhea and constipation.  Neurological: Positive for dizziness. Negative for headaches.   Physical Exam   Blood pressure 124/84, pulse 74, temperature 99 F (37.2 C), temperature source Oral, resp. rate 16, height 5\' 3"  (1.6 m), weight 174 lb (78.926 kg), last menstrual period 12/03/2013, unknown if currently breastfeeding.  Physical Exam  Constitutional: She is oriented to person, place, and time. She appears well-developed and well-nourished. No distress.  HENT:  Head: Normocephalic.  Cardiovascular: Normal rate.   Respiratory: Effort normal.  GI: Soft. She exhibits no distension and no mass. There is no tenderness (States LLQ hurts, but does not exhibit tenderness to palpation). There is no rebound and no guarding.  Musculoskeletal: Normal range of motion.  Neurological: She is alert and oriented to person, place, and time.  Skin: Skin is warm and dry.  Psychiatric: She has a normal mood and affect.    MAU Course  Procedures  MDM Tried to get records via fax from both hospitals by fax and by verbal means with NO success. Discussed options with patient. She states she thinks she needs IVF, can keep fluids down, but won't try.  Will not re-ultrasound, as  patient is adamant that they told her her pregnancy was not in her tubes. >>>>  Update:  Contacted [facility], who read her US from [place] on 01/12/14. They saw a Single GS, Yolk sac, Embryo and heartbeat.   Assessment and Plan  A:  SIUP at 6524w2d       Nausea of early pregnancy       LLQ pain, may be corpus luteum or uterine stretching  P:  Discharge home       List of providers given   Medication List    STOP taking these medications        oxyCODONE-acetaminophen 5-325 MG per tablet  Commonly known as:  ROXICET      TAKE these medications        prenatal multivitamin Tabs tablet  Take 1 tablet by mouth daily at 12 noon.     promethazine 25 MG tablet  Commonly known as:  PHENERGAN  Take 1 tablet (25 mg total) by mouth every 6 (six) hours as needed for nausea or vomiting.        Wynelle BourgeoisWILLIAMS,[name] 01/16/2014, 5:24 PM

## 2014-01-16 NOTE — MAU Note (Addendum)
Went to ED in [place] last night, for abd pain.  Pain continues and is now really dizzy room spins from med. +preg dx in [place] 2 wks ago. Pt states she went to an appointment in [place] and was told that they did not take medcaid and that her appointment today would be $250

## 2014-01-16 NOTE — Progress Notes (Signed)
Pt states she has been feeling dizzy.

## 2014-01-16 NOTE — Discharge Instructions (Signed)
Abdominal Pain During Pregnancy Abdominal pain is common in pregnancy. Most of the time, it does not cause harm. There are many causes of abdominal pain. Some causes are more serious than others. Some of the causes of abdominal pain in pregnancy are easily diagnosed. Occasionally, the diagnosis takes time to understand. Other times, the cause is not determined. Abdominal pain can be a sign that something is very wrong with the pregnancy, or the pain may have nothing to do with the pregnancy at all. For this reason, always tell your health care provider if you have any abdominal discomfort. HOME CARE INSTRUCTIONS  Monitor your abdominal pain for any changes. The following actions may help to alleviate any discomfort you are experiencing:  Do not have sexual intercourse or put anything in your vagina until your symptoms go away completely.  Get plenty of rest until your pain improves.  Drink clear fluids if you feel nauseous. Avoid solid food as long as you are uncomfortable or nauseous.  Only take over-the-counter or prescription medicine as directed by your health care provider.  Keep all follow-up appointments with your health care provider. SEEK IMMEDIATE MEDICAL CARE IF:  You are bleeding, leaking fluid, or passing tissue from the vagina.  You have increasing pain or cramping.  You have persistent vomiting.  You have painful or bloody urination.  You have a fever.  You notice a decrease in your baby's movements.  You have extreme weakness or feel faint.  You have shortness of breath, with or without abdominal pain.  You develop a severe headache with abdominal pain.  You have abnormal vaginal discharge with abdominal pain.  You have persistent diarrhea.  You have abdominal pain that continues even after rest, or gets worse. MAKE SURE YOU:   Understand these instructions.  Will watch your condition.  Will get help right away if you are not doing well or get  worse. Document Released: 01/24/2005 Document Revised: 11/14/2012 Document Reviewed: 08/23/2012 [facility] Patient Information 2015 [employer] [place]. This information is not intended to replace advice given to you by your health care provider. Make sure you discuss any questions you have with your health care provider. Morning Sickness Morning sickness is when you feel sick to your stomach (nauseous) during pregnancy. This nauseous feeling may or may not come with vomiting. It often occurs in the morning but can be a problem any time of day. Morning sickness is most common during the first trimester, but it may continue throughout pregnancy. While morning sickness is unpleasant, it is usually harmless unless you develop severe and continual vomiting (hyperemesis gravidarum). This condition requires more intense treatment.  CAUSES  The cause of morning sickness is not completely known but seems to be related to normal hormonal changes that occur in pregnancy. RISK FACTORS You are at greater risk if you:  Experienced nausea or vomiting before your pregnancy.  Had morning sickness during a previous pregnancy.  Are pregnant with more than one baby, such as twins. TREATMENT  Do not use any medicines (prescription, over-the-counter, or herbal) for morning sickness without first talking to your health care provider. Your health care provider may prescribe or recommend:  Vitamin B6 supplements.  Anti-nausea medicines.  The herbal medicine ginger. HOME CARE INSTRUCTIONS   Only take over-the-counter or prescription medicines as directed by your health care provider.  Taking multivitamins before getting pregnant can prevent or decrease the severity of morning sickness in most women.  Eat a piece of dry toast or unsalted crackers  before getting out of bed in the morning. °· Eat five or six small meals a day. °· Eat dry and bland foods (rice, baked potato). Foods high in carbohydrates are often  helpful. °· Do not drink liquids with your meals. Drink liquids between meals. °· Avoid greasy, fatty, and spicy foods. °· Get someone to cook for you if the smell of any food causes nausea and vomiting. °· If you feel nauseous after taking prenatal vitamins, take the vitamins at night or with a snack.  °· Snack on protein foods (nuts, yogurt, cheese) between meals if you are hungry. °· Eat unsweetened gelatins for desserts. °· Wearing an acupressure wristband (worn for sea sickness) may be helpful. °· Acupuncture may be helpful. °· Do not smoke. °· Get a humidifier to keep the air in your house free of odors. °· Get plenty of fresh air. °SEEK MEDICAL CARE IF:  °· Your home remedies are not working, and you need medicine. °· You feel dizzy or lightheaded. °· You are losing weight. °SEEK IMMEDIATE MEDICAL CARE IF:  °· You have persistent and uncontrolled nausea and vomiting. °· You pass out (faint). °MAKE SURE YOU: °· Understand these instructions. °· Will watch your condition. °· Will get help right away if you are not doing well or get worse. °Document Released: 03/17/2006 Document Revised: 01/29/2013 Document Reviewed: 07/11/2012 °ExitCare® Patient Information ©2015 Exit[employer], LLC. This information is not intended to replace advice given to you by your health care provider. Make sure you discuss any questions you have with your health care provider. ° °Prenatal Care Providers °[facility]    [facility] ° Phone- [phone]     Phone: [phone] °         °[facility]                      [facility] ° @[place]     Phone: [phone] ° Phone: [phone] °         [facility] °[facility]     Phone: [phone] ° Phone: [phone]   °        [facility] °[facility] [place]                hone: [phone] ° Phone: [phone] °        [facility] °Dr. Bernard Marshall      Phone:  [phone] ° Phone: [phone] °        [facility] °[facility]                Phone: [phone] ° Women’s Health  ° Phone:[phone]    [facility] ([place]) °         Phone: [phone] °[facility] °  Phone: [phone] ° ° °

## 2014-01-16 NOTE — MAU Note (Signed)
P 

## 2014-04-05 ENCOUNTER — Emergency Department: Payer: Self-pay | Admitting: Emergency Medicine

## 2014-10-13 ENCOUNTER — Encounter (HOSPITAL_COMMUNITY): Payer: Self-pay

## 2014-11-21 ENCOUNTER — Encounter (HOSPITAL_COMMUNITY): Payer: Self-pay | Admitting: *Deleted

## 2016-02-04 IMAGING — US US OB LIMITED
1 series · 14 of 26 positions shown · non-contrast
Comparison: none

CLINICAL DATA: Lower abdominal pain and cramping for 3 days.
Estimated gestational age by LMP is 17 weeks 4 days. Comparison with
previous study 01/12/2014.

EXAM:
LIMITED OBSTETRIC ULTRASOUND

[Series 1: us ob limited · 0.22mm/px · 14 of 26 slices shown]
[im 1/26]
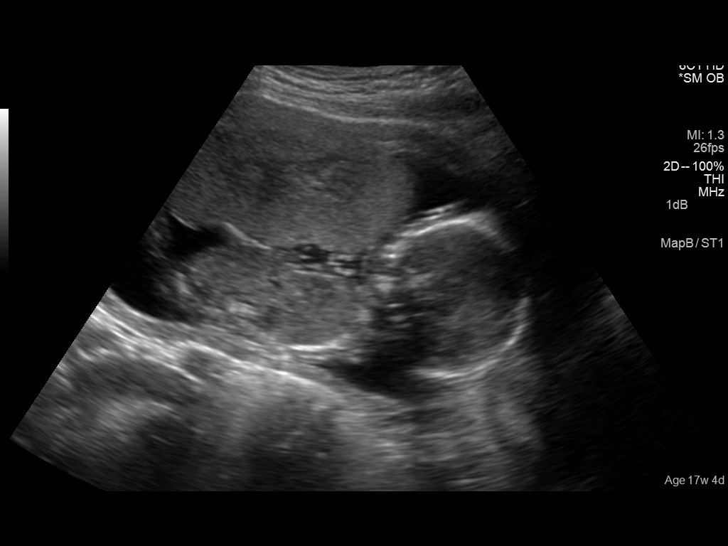
[im 3/26]
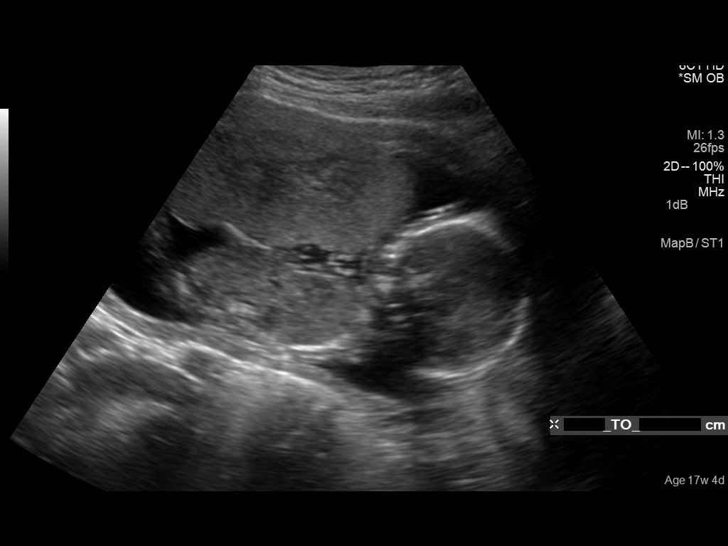
[im 5/26]
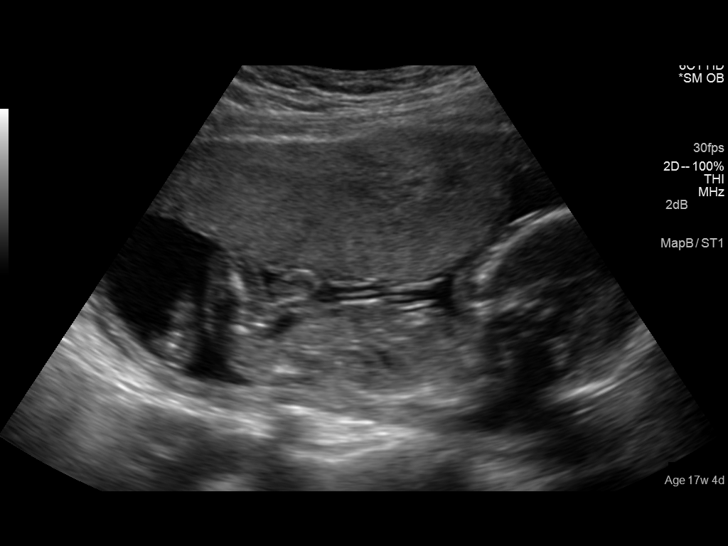
[im 7/26]
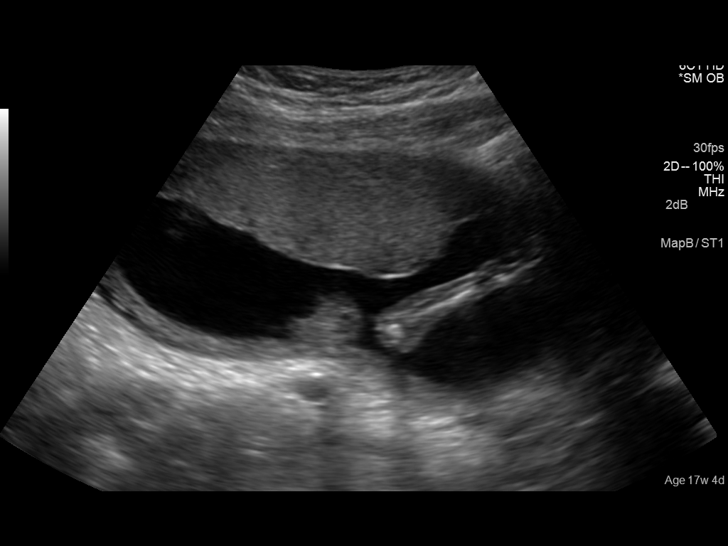
[im 9/26]
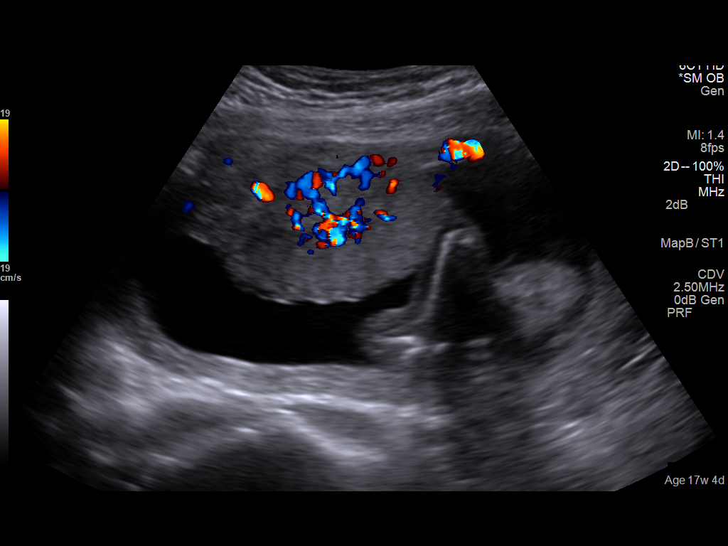
[im 11/26]
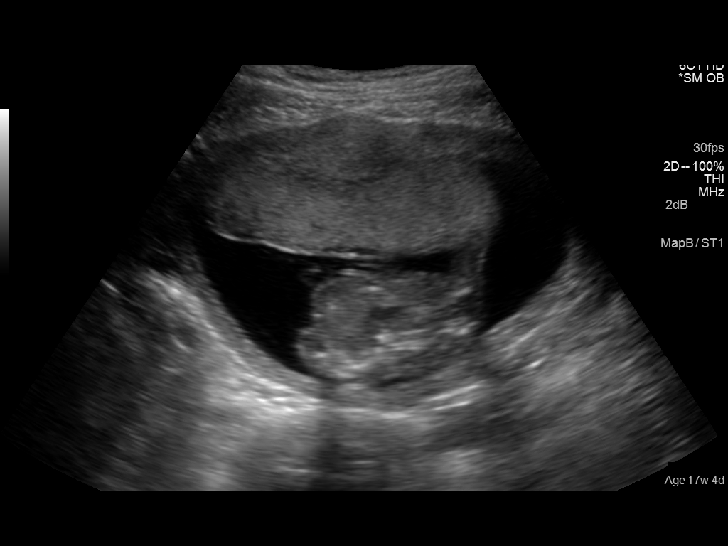
[im 13/26]
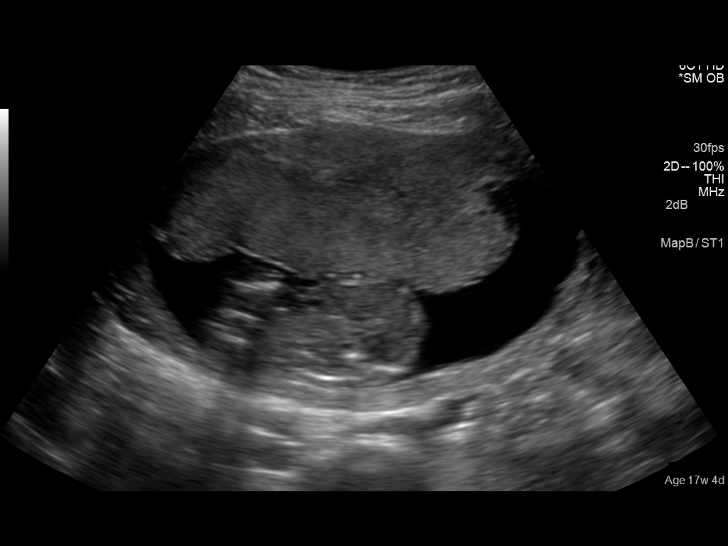
[im 14/26]
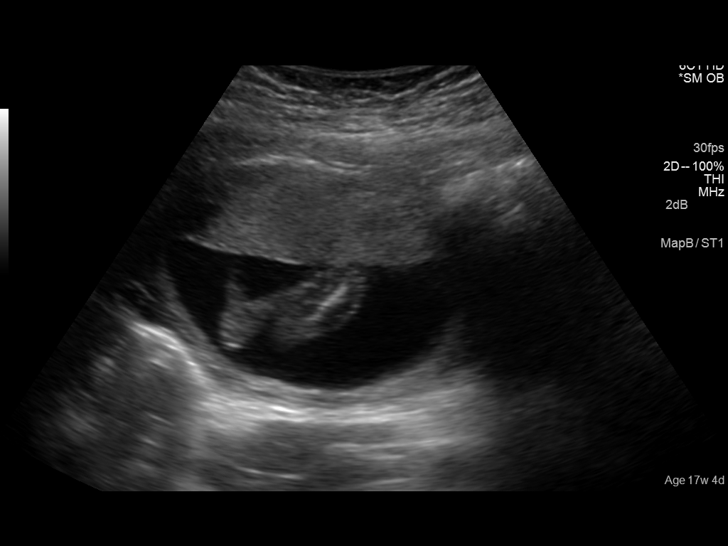
[im 16/26]
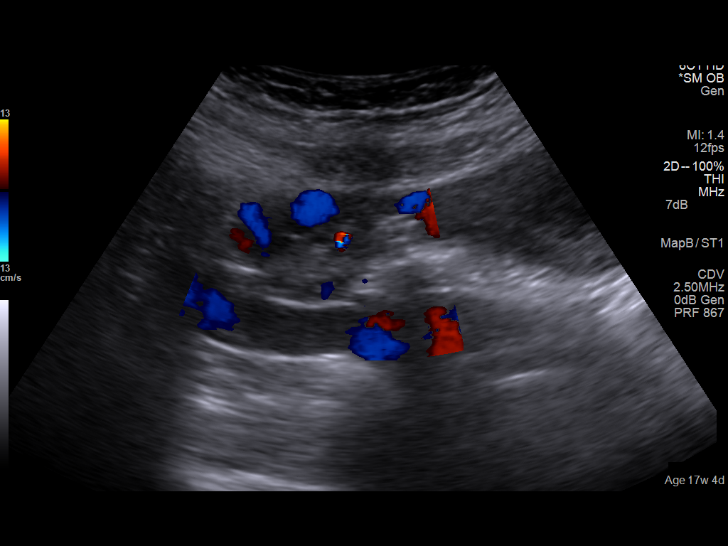
[im 18/26]
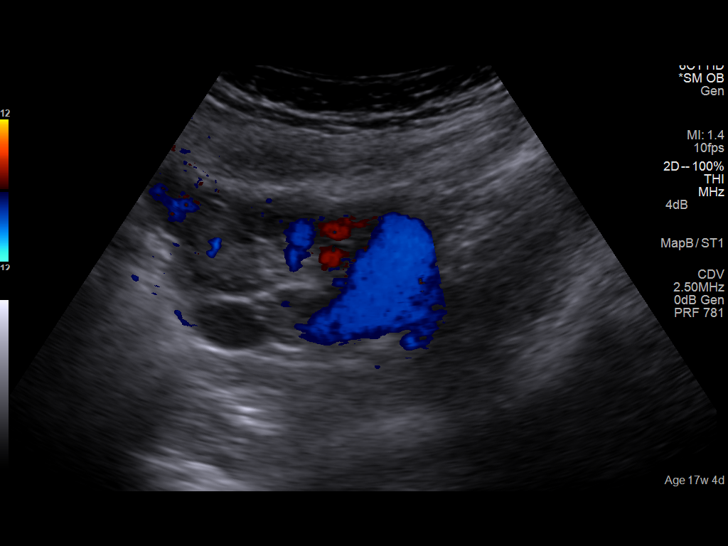
[im 20/26]
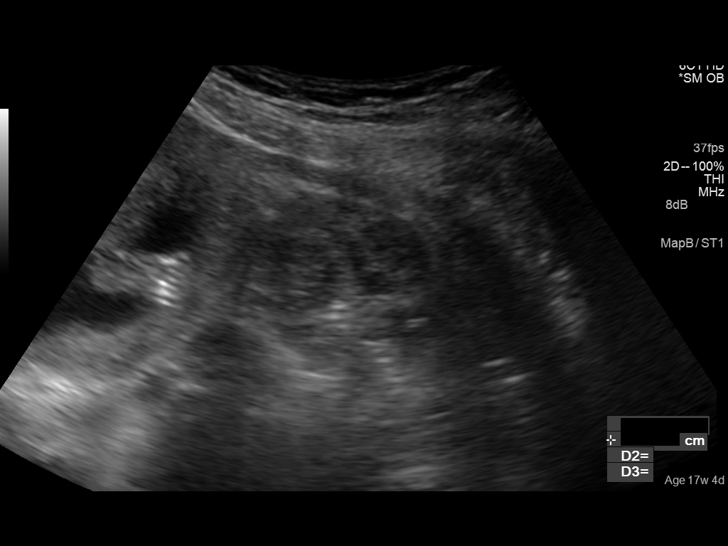
[im 22/26]
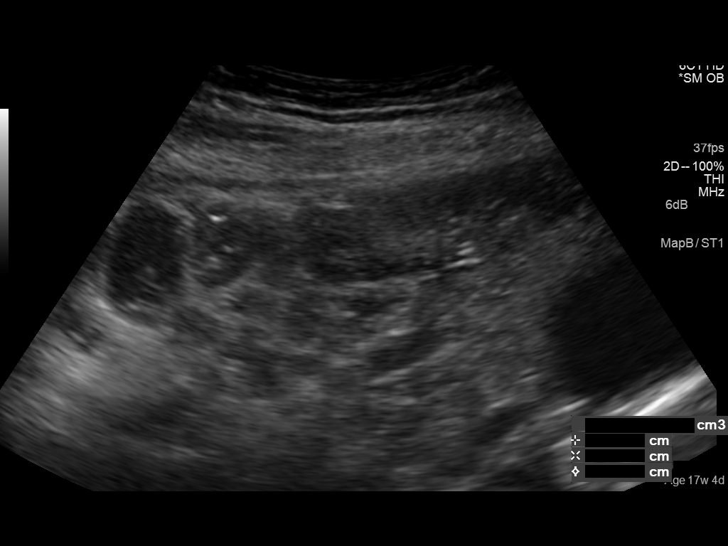
[im 24/26]
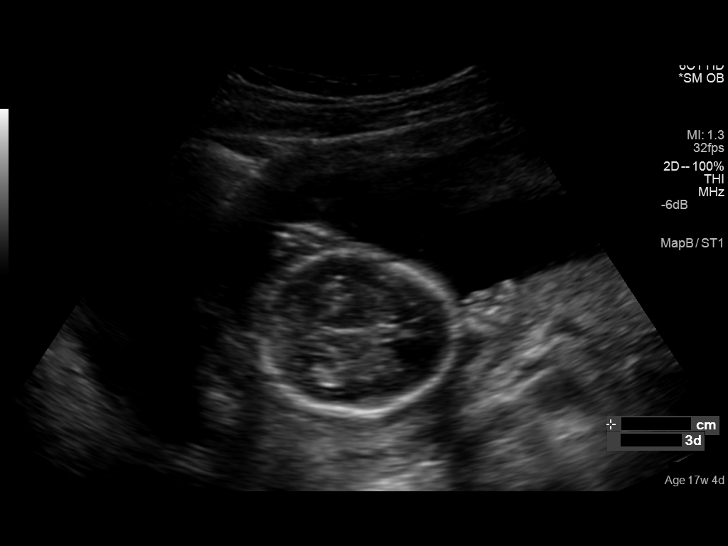
[im 26/26]
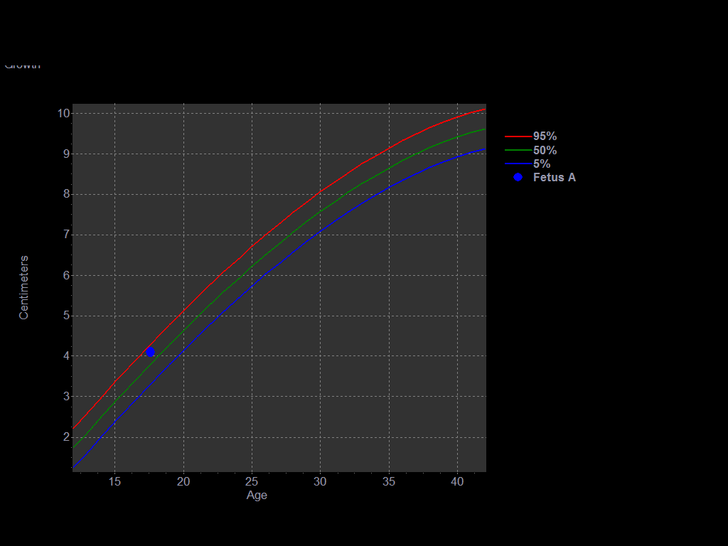

[14 of 26 positions shown; findings below may reference images not displayed]

FINDINGS: Number of Fetuses: 1

Heart Rate:  155 bpm

Movement: Yes

Presentation: Fetus is in cephalic presentation.

Placental Location: Anterior

Previa: No

Amniotic Fluid (Subjective):  Within normal limits.

BPD:  4.1cm 18w  3d

MATERNAL FINDINGS:

Cervix:  Cervix appears closed.  Cervical length measures 3.6 cm.

Uterus/Adnexae: Limited evaluation of the uterus. Both ovaries are
visualized and appear normal.
IMPRESSION: Single intrauterine pregnancy. No acute complication is suggested on
limited evaluation.

This exam is performed on an emergent basis and does not
comprehensively evaluate fetal size, dating, or anatomy; follow-up
complete OB US should be considered if further fetal assessment is
warranted.

## 2016-05-24 IMAGING — US US OB TRANSVAGINAL
1 series · 14 of 28 positions shown · non-contrast
Comparison: None.

CLINICAL DATA: Lower abdominal pelvic pain, quantitative beta HCG
129

EXAM:
OBSTETRIC <14 WK US AND TRANSVAGINAL OB US
TECHNIQUE: Both transabdominal and transvaginal ultrasound examinations were
performed for complete evaluation of the gestation as well as the
maternal uterus, adnexal regions, and pelvic cul-de-sac.
Transvaginal technique was performed to assess early pregnancy.

[Series 1: us ob comp less 14 wks · 14 of 67 slices shown]
[im 3/67]
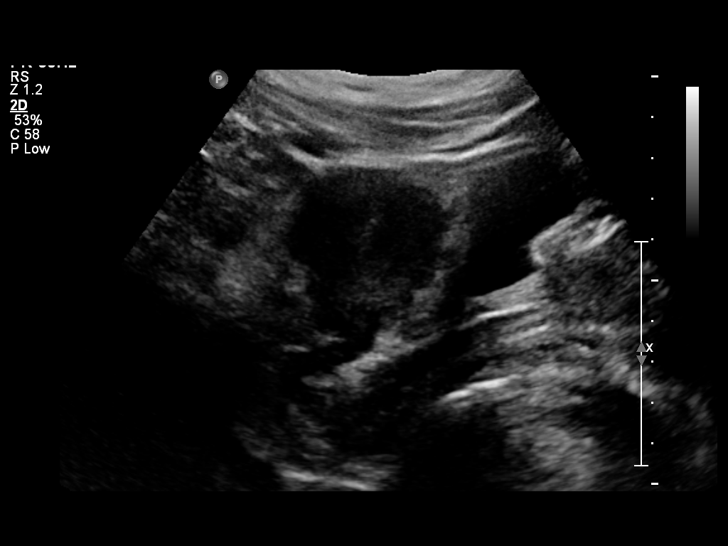
[im 8/67]
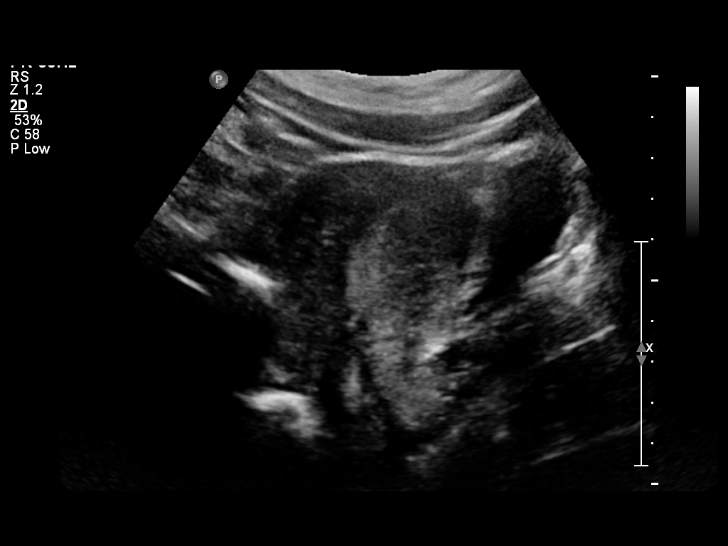
[im 13/67]
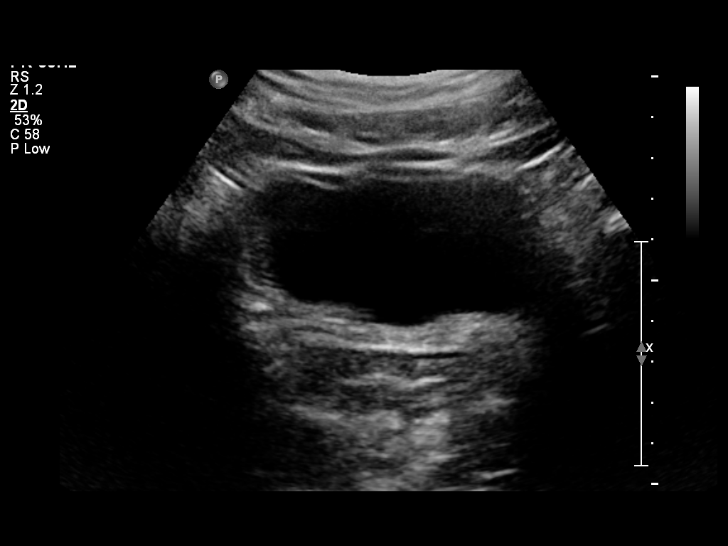
[im 18/67]
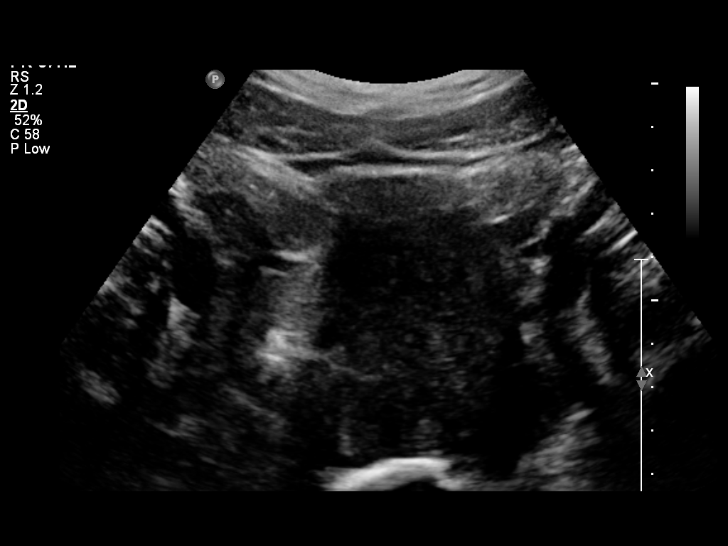
[im 23/67]
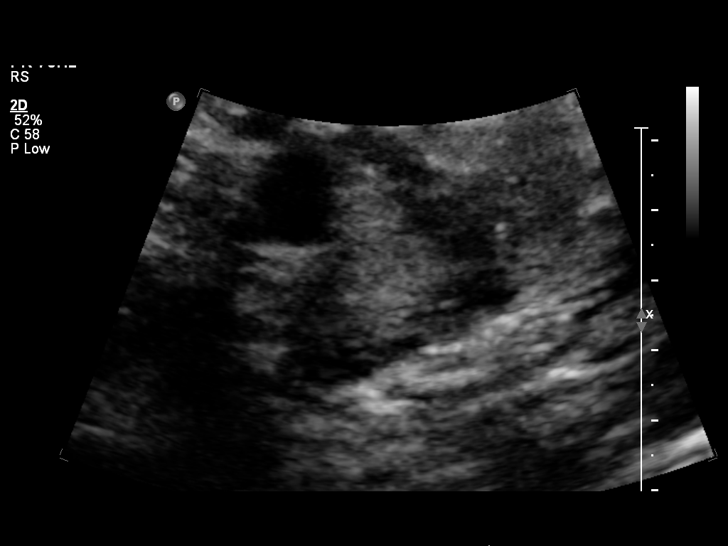
[im 27/67]
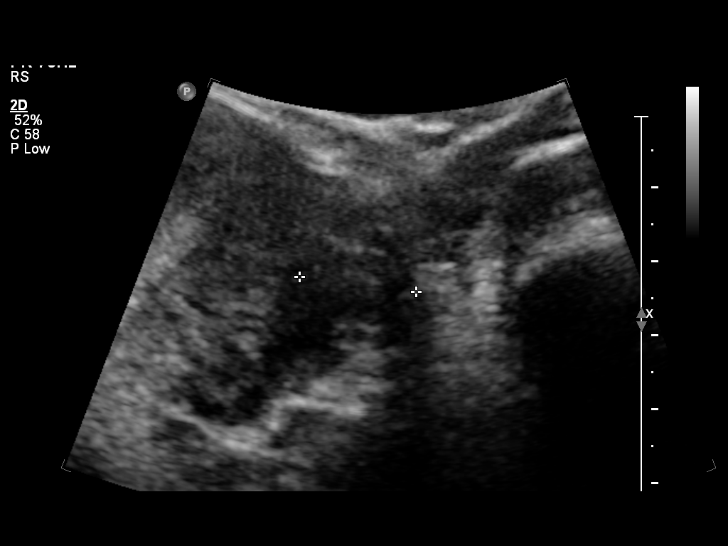
[im 32/67]
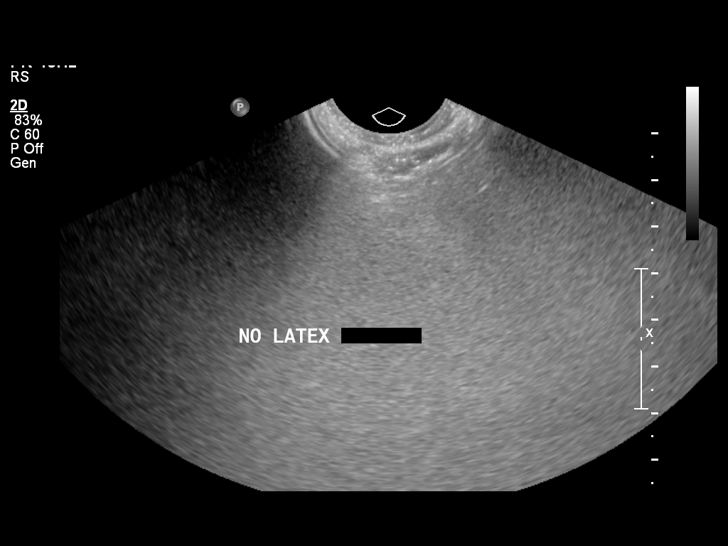
[im 37/67]
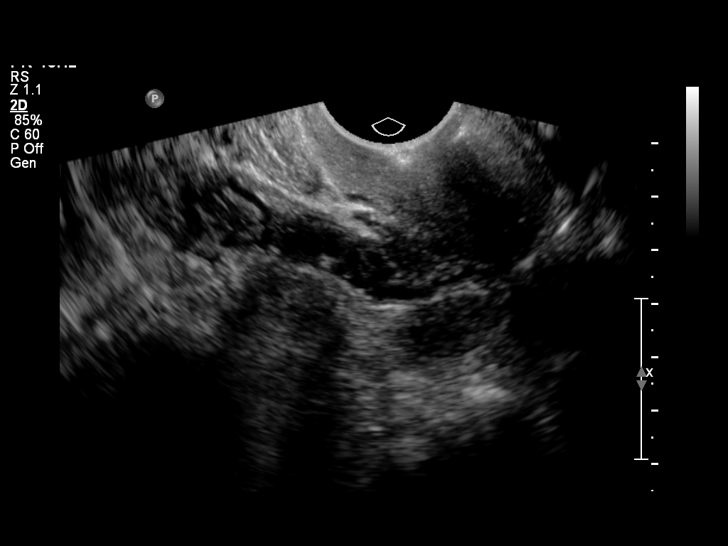
[im 42/67]
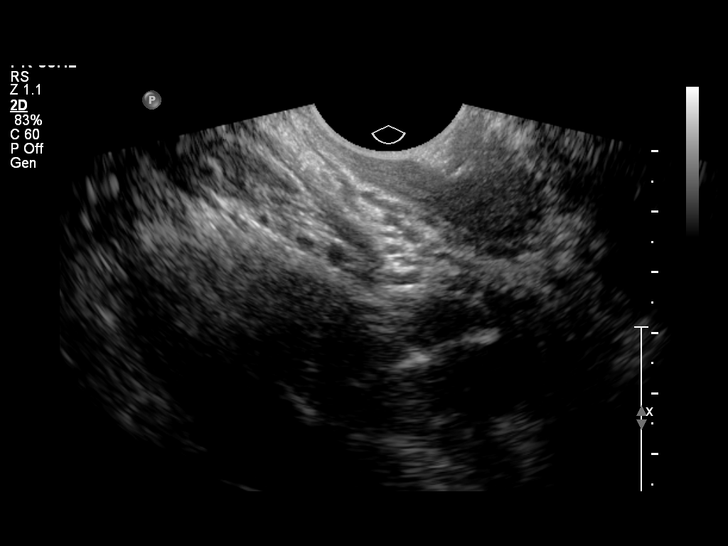
[im 47/67]
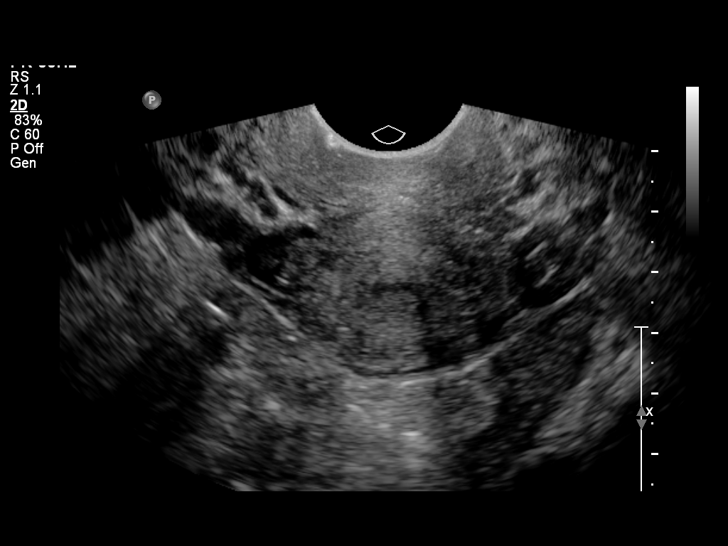
[im 52/67]
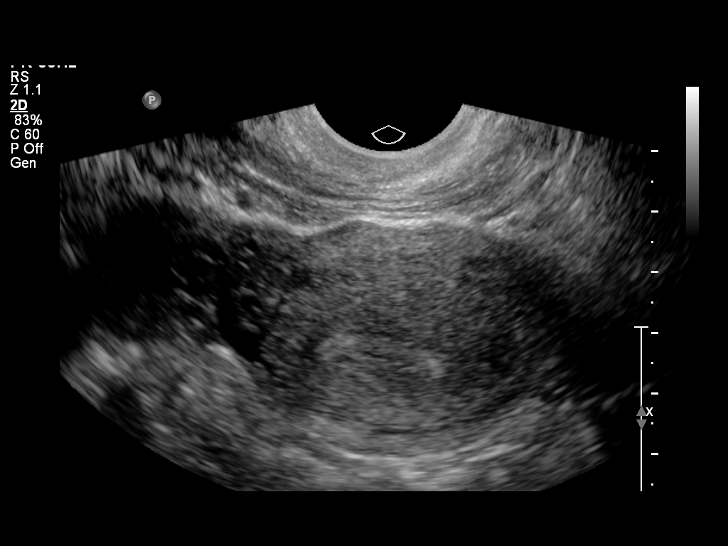
[im 57/67]
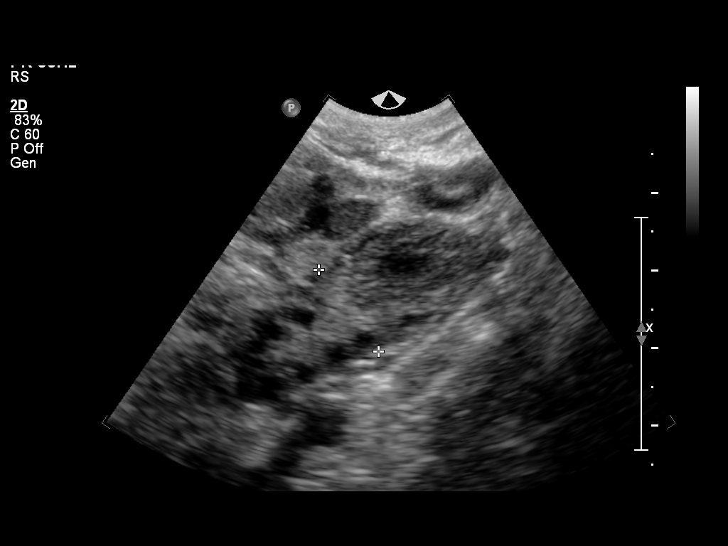
[im 62/67]
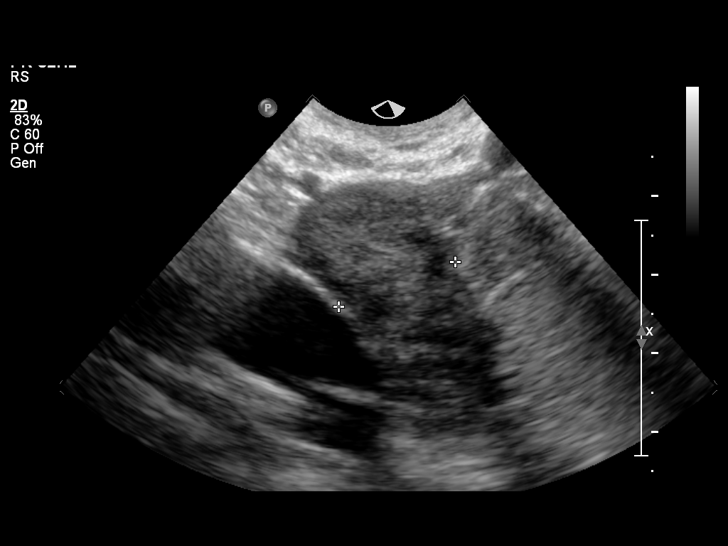
[im 67/67]
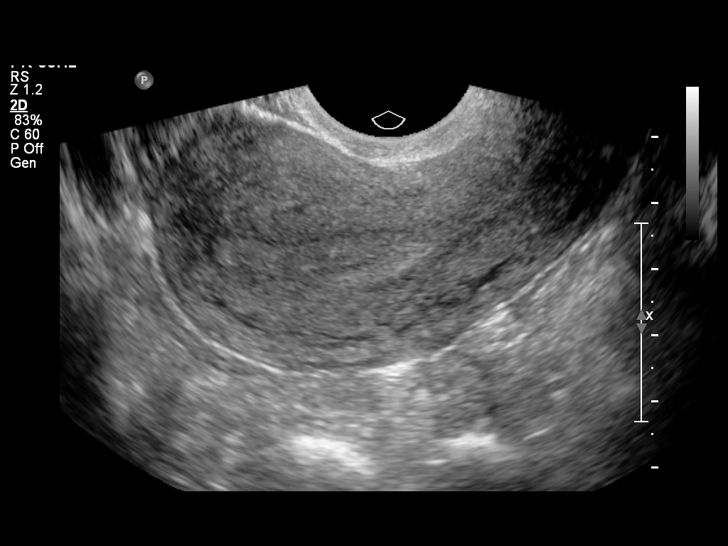

[14 of 28 positions shown; findings below may reference images not displayed]

FINDINGS: Intrauterine gestational sac: Not visualized

Yolk sac:  Not visualized

Embryo:  Not visualized

Cardiac Activity: Not visualized

Heart Rate:  Not detected

Maternal uterus/adnexae: Uterus and endometrium appear normal for
age and unremarkable. No visualized IUP or adnexal abnormality.
Ovaries are unremarkable. No significant free fluid. Trace
physiologic fluid noted.
IMPRESSION: No visualized intrauterine pregnancy by ultrasound. No acute finding
evident.

## 2018-11-04 ENCOUNTER — Emergency Department: Admission: EM | Admit: 2018-11-04 | Discharge: 2018-11-04 | Discharge status: Against Medical Advice | Payer: 59

## 2018-11-04 ENCOUNTER — Other Ambulatory Visit: Payer: Self-pay

## 2023-05-04 ENCOUNTER — Emergency Department
Admission: EM | Admit: 2023-05-04 | Discharge: 2023-05-04 | Disposition: A | Discharge status: Home / Self Care | Attending: Emergency Medicine | Admitting: Emergency Medicine

## 2023-05-04 ENCOUNTER — Other Ambulatory Visit: Payer: Self-pay

## 2023-05-04 DIAGNOSIS — R5383 Other fatigue: Secondary | ICD-10-CM | POA: Insufficient documentation

## 2023-05-04 DIAGNOSIS — R5381 Other malaise: Secondary | ICD-10-CM | POA: Diagnosis present

## 2023-05-04 DIAGNOSIS — R531 Weakness: Secondary | ICD-10-CM | POA: Insufficient documentation

## 2023-05-04 DIAGNOSIS — R519 Headache, unspecified: Secondary | ICD-10-CM | POA: Diagnosis not present

## 2023-05-04 DIAGNOSIS — J111 Influenza due to unidentified influenza virus with other respiratory manifestations: Secondary | ICD-10-CM

## 2023-05-04 LAB — RESP PANEL BY RT-PCR (RSV, FLU A&B, COVID)  RVPGX2
Influenza A by PCR: NEGATIVE
Influenza B by PCR: NEGATIVE
Resp Syncytial Virus by PCR: NEGATIVE
SARS Coronavirus 2 by RT PCR: NEGATIVE

## 2023-05-04 MED ORDER — ACETAMINOPHEN 500 MG PO TABS
1000.0000 mg | ORAL_TABLET | Freq: Once | ORAL | Status: AC
  Administered 2023-05-04: 1000 mg via ORAL
  Filled 2023-05-04: qty 2

## 2023-05-04 MED ORDER — IBUPROFEN 600 MG PO TABS
600.0000 mg | ORAL_TABLET | Freq: Once | ORAL | Status: AC
  Administered 2023-05-04: 600 mg via ORAL
  Filled 2023-05-04: qty 1

## 2023-05-04 NOTE — ED Provider Notes (Signed)
 [facility] Provider Note    Event Date/Time   First MD Initiated Contact with Patient 05/04/23 [phone]     (approximate)   History   Generalized Body Aches   HPI [name] is a 33 y.o. female who presents for evaluation of symptoms that started earlier today that include generalized body aches, general malaise, generalized weakness, fatigue, and a generalized headache.  Symptoms started mild and then gradually got worse.  She works at a [employer] facility and is around multiple people that have had viral illnesses including influenza.  She denies sore throat, chest pain, shortness of breath, nausea, vomiting, and abdominal pain.  No recent dysuria.  No history of migraine.  No visual changes.  No recent trauma.     Physical Exam   Triage Vital Signs: ED Triage Vitals  Encounter Vitals Group     BP 05/04/23 0216 120/76     Systolic BP Percentile --      Diastolic BP Percentile --      Pulse Rate 05/04/23 0216 72     Resp 05/04/23 0216 18     Temp 05/04/23 0216 98.1 F (36.7 C)     Temp Source 05/04/23 0216 Oral     SpO2 05/04/23 0216 100 %     Weight 05/04/23 0224 78.5 kg (173 lb)     Height 05/04/23 0224 1.651 m (5\' 5" )     Head Circumference --      Peak Flow --      Pain Score 05/04/23 0224 6     Pain Loc --      Pain Education --      Exclude from Growth Chart --     Most recent vital signs: Vitals:   05/04/23 0216  BP: 120/76  Pulse: 72  Resp: 18  Temp: 98.1 F (36.7 C)  SpO2: 100%    General: Awake, no obvious distress. Eyes:  Pupils are equal and reactive.  Extraocular movement is intact and normal.  No nystagmus. CV:  Good peripheral perfusion.  Regular rate and rhythm. Resp:  Normal effort. Speaking easily and comfortably, no accessory muscle usage nor intercostal retractions.   Abd:  No distention.    ED Results / Procedures / Treatments   Labs (all labs ordered are listed, but only abnormal results are  displayed) Labs Reviewed  RESP PANEL BY RT-PCR (RSV, FLU A&B, COVID)  [id number]      PROCEDURES:  Critical Care performed: No  Procedures    IMPRESSION / MDM / ASSESSMENT AND PLAN / ED COURSE  I reviewed the triage vital signs and the nursing notes.                              Differential diagnosis includes, but is not limited to, viral illness, migraine, other nonspecific headache, intracranial hemorrhage, neoplasm.  Patient's presentation is most consistent with acute, uncomplicated illness.  Labs/studies ordered: Respiratory viral panel  Interventions/Medications given:  Medications  acetaminophen (TYLENOL) tablet 1,000 mg (1,000 mg Oral Given 05/04/23 0520)  ibuprofen (ADVIL) tablet 600 mg (600 mg Oral Given 05/04/23 0520)    (Note:  hospital course my include additional interventions and/or labs/studies not listed above.)   Patient has normal vitals and a reassuring and normal physical exam.  Symptoms are most suggestive of influenza-like illness but her respiratory viral panel is negative.  I offered placement of an IV and treating as it  for a migraine versus the patient can take over-the-counter analgesia and go home for rehydration and rest.  She opted for the latter.    The patient's medical screening exam is reassuring with no indication of an emergent medical condition requiring hospitalization or additional evaluation at this point.  The patient is safe and appropriate for discharge and outpatient follow up.       FINAL CLINICAL IMPRESSION(S) / ED DIAGNOSES   Final diagnoses:  Influenza-like illness  Generalized headache     Rx / DC Orders   ED Discharge Orders     None        Note:  This document was prepared using Dragon voice recognition software and may include unintentional dictation errors.   Loleta Rose, MD 05/04/23 [phone]

## 2023-05-04 NOTE — Discharge Instructions (Addendum)
 You have been seen in the Emergency Department (ED) today for a likely viral illness.  Please drink plenty of clear fluids (water, Gatorade, chicken broth, etc).  You may use Tylenol and/or Motrin according to label instructions.  You can alternate between the two without any side effects.   Please follow up with your doctor as listed above.  Call your doctor or return to the Emergency Department (ED) if you are unable to tolerate fluids due to vomiting, have worsening trouble breathing, become extremely tired or difficult to awaken, or if you develop any other symptoms that concern you.

## 2023-05-04 NOTE — ED Triage Notes (Signed)
 Pt arrives via POV with CC of headache and generalized body aches for the last eight hours. Denies CP, blurred vision, and SOB. 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 NAD at this time.

## 2023-06-29 ENCOUNTER — Encounter: Payer: Self-pay | Admitting: Physician Assistant

## 2023-08-17 NOTE — Progress Notes (Deleted)
     08/17/2023 MARENA WITTS [id number] 1990/09/14  Referring provider: Arloa Almarie NOVAK, FNP Primary GI doctor: {[id number]}  ASSESSMENT AND PLAN:   microcytic anemia 06/27/2022 CT abdomen pelvis with contrast for dysuria and back pain unremarkable liver gallbladder pancreas spleen unremarkable bowels Patient's had an anemia since at least 2015 11/08/2022 Hgb 9.2, MCV 72, platelets 324, WBC 5.5  Patient Care Team: Pcp, No as PCP - General  HISTORY OF PRESENT ILLNESS: 33 y.o. female with a past medical history listed below presents as a new patient for evaluation of anemia and rectal bleeding.   *** Discussed the use of AI scribe software for clinical note transcription with the patient, who gave verbal consent to proceed.  History of Present Illness            She  reports that she has quit smoking. She has never used smokeless tobacco. She reports that she does not drink alcohol and does not use drugs.  RELEVANT GI HISTORY, IMAGING AND LABS: Results          CBC    Component Value Date/Time   WBC 4.9 08/22/2013 0025   RBC 3.86 (L) 08/22/2013 0025   HGB 10.4 (L) 08/22/2013 0025   HCT 33.0 (L) 08/22/2013 0025   PLT 229 08/22/2013 0025   MCV 85.5 08/22/2013 0025   MCH 26.9 08/22/2013 0025   MCHC 31.5 08/22/2013 0025   RDW 14.5 08/22/2013 0025   LYMPHSABS 2.4 08/22/2013 0025   MONOABS 0.4 08/22/2013 0025   EOSABS 0.0 08/22/2013 0025   BASOSABS 0.0 08/22/2013 0025   No results for input(s): HGB in the last 8760 hours.  CMP     Component Value Date/Time   NA 135 (L) 01/16/2014 1739   K 4.3 01/16/2014 1739   CL 100 01/16/2014 1739   CO2 23 01/16/2014 1739   GLUCOSE 84 01/16/2014 1739   BUN 12 01/16/2014 1739   CREATININE 0.75 01/16/2014 1739   CALCIUM 9.2 01/16/2014 1739   GFRNONAA >90 01/16/2014 1739   GFRAA >90 01/16/2014 1739       No data to display            Current Medications:     Current Outpatient Medications  (Respiratory):    promethazine  (PHENERGAN ) 25 MG tablet, Take 1 tablet (25 mg total) by mouth every 6 (six) hours as needed for nausea or vomiting.    Current Outpatient Medications (Other):    Prenatal Vit-Fe Fumarate-FA (PRENATAL MULTIVITAMIN) TABS tablet, Take 1 tablet by mouth daily at 12 noon.  Medical History:  Past Medical History:  Diagnosis Date   Medical history non-contributory    Allergies:  Allergies  Allergen Reactions   Tramadol Other (See Comments)    Causes dizziness.     Surgical History:  She  has a past surgical history that includes No past surgeries. Family History:  Her family history is not on file.  REVIEW OF SYSTEMS  : All other systems reviewed and negative except where noted in the History of Present Illness.  PHYSICAL EXAM: There were no vitals taken for this visit. Physical Exam          Alan JONELLE Coombs, PA-C 12:44 PM

## 2023-08-18 ENCOUNTER — Ambulatory Visit: Admitting: Physician Assistant
